# Patient Record
Sex: Male | Born: 1980 | Race: White | Hispanic: No | Marital: Single | Smoking: Current every day smoker
Health system: Southern US, Community
[De-identification: ages and names within clinical notes are randomized; demographics above are authoritative.]

## PROBLEM LIST (undated history)

## (undated) ENCOUNTER — Emergency Department: Admission: EM | Payer: BLUE CROSS/BLUE SHIELD | Source: Home / Self Care

## (undated) DIAGNOSIS — F419 Anxiety disorder, unspecified: Secondary | ICD-10-CM

## (undated) HISTORY — PX: FOOT SURGERY: SHX648

## (undated) HISTORY — PX: OTHER SURGICAL HISTORY: SHX169

---

## 2005-02-09 ENCOUNTER — Emergency Department: Payer: Self-pay | Admitting: General Practice

## 2005-02-10 ENCOUNTER — Emergency Department (HOSPITAL_COMMUNITY): Admission: EM | Admit: 2005-02-10 | Discharge: 2005-02-10 | Payer: Self-pay | Admitting: Emergency Medicine

## 2015-07-17 ENCOUNTER — Emergency Department
Admission: EM | Admit: 2015-07-17 | Discharge: 2015-07-17 | Disposition: A | Discharge status: Home / Self Care | Payer: BLUE CROSS/BLUE SHIELD | Attending: Emergency Medicine | Admitting: Emergency Medicine

## 2015-07-17 ENCOUNTER — Emergency Department: Payer: BLUE CROSS/BLUE SHIELD

## 2015-07-17 ENCOUNTER — Encounter: Payer: Self-pay | Admitting: *Deleted

## 2015-07-17 DIAGNOSIS — F17200 Nicotine dependence, unspecified, uncomplicated: Secondary | ICD-10-CM | POA: Insufficient documentation

## 2015-07-17 DIAGNOSIS — W172XXA Fall into hole, initial encounter: Secondary | ICD-10-CM | POA: Insufficient documentation

## 2015-07-17 DIAGNOSIS — Y92321 Football field as the place of occurrence of the external cause: Secondary | ICD-10-CM | POA: Diagnosis not present

## 2015-07-17 DIAGNOSIS — Y9362 Activity, american flag or touch football: Secondary | ICD-10-CM | POA: Diagnosis not present

## 2015-07-17 DIAGNOSIS — S4991XA Unspecified injury of right shoulder and upper arm, initial encounter: Secondary | ICD-10-CM | POA: Diagnosis present

## 2015-07-17 DIAGNOSIS — S46911A Strain of unspecified muscle, fascia and tendon at shoulder and upper arm level, right arm, initial encounter: Secondary | ICD-10-CM | POA: Insufficient documentation

## 2015-07-17 DIAGNOSIS — Y998 Other external cause status: Secondary | ICD-10-CM | POA: Diagnosis not present

## 2015-07-17 HISTORY — DX: Anxiety disorder, unspecified: F41.9

## 2015-07-17 MED ORDER — CYCLOBENZAPRINE HCL 5 MG PO TABS: 5.0000 mg | ORAL_TABLET | Freq: Three times a day (TID) | ORAL | Status: AC | PRN

## 2015-07-17 MED ORDER — TRAMADOL HCL 50 MG PO TABS: 50.0000 mg | ORAL_TABLET | Freq: Three times a day (TID) | ORAL | Status: DC | PRN

## 2015-07-17 MED ORDER — NABUMETONE 750 MG PO TABS: 750.0000 mg | ORAL_TABLET | Freq: Two times a day (BID) | ORAL | Status: AC

## 2015-07-17 MED ORDER — HYDROCODONE-ACETAMINOPHEN 5-325 MG PO TABS: 1.0000 | ORAL_TABLET | Freq: Four times a day (QID) | ORAL | Status: DC | PRN

## 2015-07-17 NOTE — Discharge Instructions (Signed)
Your exam is normal today. Your x-ray does not show any signs of fracture or dislocation. You are being treated for a shoulder strain. Apply ice and take the prescription meds as directed. Follow-up with Dr. Ernest PineHooten for ongoing symptoms.

## 2015-07-17 NOTE — ED Provider Notes (Signed)
[facility] Emergency Department Provider Note ____________________________________________  Time seen: 0858  I have reviewed the triage vital signs and the nursing notes.  HISTORY  Chief Complaint  Shoulder Injury  HPI Eugene Zimmerman is a 34 y.o. male was ED for evaluation of right shoulder pain since last night. He describes pain in than out visiting family, was playing a game of touch football last night. He describes falling in a shallow hole in the yard, this caused him to land on his right shoulder. He gives a remote history of rotator cuff partial tear some 8 or 9 years prior, without surgical intervention. He also describes some intermittent shoulder laxity and subluxation over the years.He describes dosing Advil and Tylenol this morning. He describes the pain as sharp and stabbing to the shoulder.  Past Medical History  Diagnosis Date  . Anxiety    There are no active problems to display for this patient.  History reviewed. No pertinent past surgical history.  Current Outpatient Rx  Name  Route  Sig  Dispense  Refill  . cyclobenzaprine (FLEXERIL) 5 MG tablet   Oral   Take 1 tablet (5 mg total) by mouth 3 (three) times daily as needed for muscle spasms.   15 tablet   0   . HYDROcodone-acetaminophen (NORCO) 5-325 MG tablet   Oral   Take 1 tablet by mouth every 6 (six) hours as needed for moderate pain.   10 tablet   0   . nabumetone (RELAFEN) 750 MG tablet   Oral   Take 1 tablet (750 mg total) by mouth 2 (two) times daily.   30 tablet   0     Allergies Tramadol  No family history on file.  Social History Social History  Substance Use Topics  . Smoking status: Current Every Day Smoker  . Smokeless tobacco: None  . Alcohol Use: None    Review of Systems  Constitutional: Negative for fever. Eyes: Negative for visual changes. ENT: Negative for sore throat. Cardiovascular: Negative for chest pain. Respiratory: Negative for  shortness of breath. Gastrointestinal: Negative for abdominal pain, vomiting and diarrhea. Genitourinary: Negative for dysuria. Musculoskeletal: Negative for back pain. Right shoulder pain Skin: Negative for rash. Neurological: Negative for headaches, focal weakness or numbness. ____________________________________________  PHYSICAL EXAM:  VITAL SIGNS: ED Triage Vitals  Enc Vitals Group     BP 07/17/15 0842 145/82 mmHg     Pulse Rate 07/17/15 0842 76     Resp 07/17/15 0842 20     Temp 07/17/15 0842 97.8 F (36.6 C)     Temp Source 07/17/15 0842 Oral     SpO2 07/17/15 0842 99 %     Weight 07/17/15 0842 180 lb (81.647 kg)     Height 07/17/15 0842 6\' 2"  (1.88 m)     Head Cir --      Peak Flow --      Pain Score 07/17/15 0843 8     Pain Loc --      Pain Edu? --      Excl. in GC? --     Constitutional: Alert and oriented. Well appearing and in no distress. Head: Normocephalic and atraumatic.      Eyes: Conjunctivae are normal. PERRL. Normal extraocular movements      Ears: Canals clear. TMs intact bilaterally.   Nose: No congestion/rhinorrhea.   Mouth/Throat: Mucous membranes are moist.   Neck: Supple. No thyromegaly. Hematological/Lymphatic/Immunological: No cervical lymphadenopathy. Cardiovascular: Normal rate, regular rhythm.  Respiratory:  Normal respiratory effort. No wheezes/rales/rhonchi. Gastrointestinal: Soft and nontender. No distention. Musculoskeletal: Right shoulder without obvious deformity, sulcus sign, or dislocation. Patient holding the arm in a flexed,  adducted position. He noted to have self-limited range of the shoulder with full active range of motion at the elbow with flexion, extension, and rotation. I am able to passively range him in abduction to 90. He is minimally tender to palpation over the biceps tendon of the right shoulder. Nontender with normal range of motion in all extremities.  Neurologic: Normal composite fist on exam. Normal gait  without ataxia. Normal speech and language. No gross focal neurologic deficits are appreciated. Skin:  Skin is warm, dry and intact. No rash noted. Psychiatric: Mood and affect are normal. Patient exhibits appropriate insight and judgment. ____________________________________________   RADIOLOGY  Right Shoulder IMPRESSION: Normal radiographs  I, Correne Lalani, Charlesetta Ivory, personally viewed and evaluated these images (plain radiographs) as part of my medical decision making, as well as reviewing the written report by the radiologist. ____________________________________________  PROCEDURES  Shoulder immobilizer ____________________________________________  INITIAL IMPRESSION / ASSESSMENT AND PLAN / ED COURSE  Right shoulder strain without evidence of fracture, or dislocation. Patient with a difficult shoulder exam today on presentation. He'll be discharged with a shoulder sling for comfort and support and prescriptions for Relafen, Flexeril, and #10 hydrocodone. He'll follow up with Dr. Ernest Pine or his primary ortho provider for ongoing symptoms. ____________________________________________  FINAL CLINICAL IMPRESSION(S) / ED DIAGNOSES  Final diagnoses:  Shoulder strain, right, initial encounter      Lissa Hoard, PA-C 07/17/15 1104  Sharman Cheek, MD 07/17/15 [phone]

## 2015-08-08 ENCOUNTER — Emergency Department: Payer: BLUE CROSS/BLUE SHIELD

## 2015-08-08 ENCOUNTER — Emergency Department
Admission: EM | Admit: 2015-08-08 | Discharge: 2015-08-09 | Disposition: A | Discharge status: Home / Self Care | Payer: BLUE CROSS/BLUE SHIELD | Attending: Emergency Medicine | Admitting: Emergency Medicine

## 2015-08-08 ENCOUNTER — Encounter: Payer: Self-pay | Admitting: Emergency Medicine

## 2015-08-08 DIAGNOSIS — Y92410 Unspecified street and highway as the place of occurrence of the external cause: Secondary | ICD-10-CM | POA: Insufficient documentation

## 2015-08-08 DIAGNOSIS — Y998 Other external cause status: Secondary | ICD-10-CM | POA: Diagnosis not present

## 2015-08-08 DIAGNOSIS — T148XXA Other injury of unspecified body region, initial encounter: Secondary | ICD-10-CM

## 2015-08-08 DIAGNOSIS — S299XXA Unspecified injury of thorax, initial encounter: Secondary | ICD-10-CM | POA: Diagnosis not present

## 2015-08-08 DIAGNOSIS — S199XXA Unspecified injury of neck, initial encounter: Secondary | ICD-10-CM | POA: Insufficient documentation

## 2015-08-08 DIAGNOSIS — Y9389 Activity, other specified: Secondary | ICD-10-CM | POA: Insufficient documentation

## 2015-08-08 DIAGNOSIS — T148 Other injury of unspecified body region: Secondary | ICD-10-CM | POA: Insufficient documentation

## 2015-08-08 DIAGNOSIS — M542 Cervicalgia: Secondary | ICD-10-CM

## 2015-08-08 DIAGNOSIS — F17200 Nicotine dependence, unspecified, uncomplicated: Secondary | ICD-10-CM | POA: Insufficient documentation

## 2015-08-08 DIAGNOSIS — Z79899 Other long term (current) drug therapy: Secondary | ICD-10-CM | POA: Insufficient documentation
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
## 2015-08-08 NOTE — ED Notes (Signed)
Patient transported to CT 

## 2015-08-09 LAB — CBC WITH DIFFERENTIAL/PLATELET
BASOS ABS: 0.2 10*3/uL — AB (ref 0–0.1)
Basophils Relative: 3 %
EOS ABS: 0.1 10*3/uL (ref 0–0.7)
EOS PCT: 1 %
HCT: 40.1 % (ref 40.0–52.0)
Hemoglobin: 13.7 g/dL (ref 13.0–18.0)
Lymphocytes Relative: 25 %
Lymphs Abs: 1.7 10*3/uL (ref 1.0–3.6)
MCH: 32.3 pg (ref 26.0–34.0)
MCHC: 34.1 g/dL (ref 32.0–36.0)
MCV: 94.7 fL (ref 80.0–100.0)
MONO ABS: 1 10*3/uL (ref 0.2–1.0)
Monocytes Relative: 14 %
Neutro Abs: 3.9 10*3/uL (ref 1.4–6.5)
Neutrophils Relative %: 57 %
PLATELETS: 328 10*3/uL (ref 150–440)
RBC: 4.24 MIL/uL — AB (ref 4.40–5.90)
RDW: 13.3 % (ref 11.5–14.5)
WBC: 6.9 10*3/uL (ref 3.8–10.6)

## 2015-08-09 LAB — TROPONIN I

## 2015-08-09 MED ORDER — CYCLOBENZAPRINE HCL 5 MG PO TABS: 5.0000 mg | ORAL_TABLET | Freq: Three times a day (TID) | ORAL | Status: AC | PRN

## 2015-08-09 NOTE — ED Notes (Signed)
MD Goodman at bedside. 

## 2015-08-09 NOTE — Discharge Instructions (Signed)
Please seek medical attention for any high fevers, chest pain, shortness of breath, change in behavior, persistent vomiting, bloody stool or any other new or concerning symptoms.  Chest Contusion A chest contusion is a deep bruise on your chest area. Contusions are the result of an injury that caused bleeding under the skin. A chest contusion may involve bruising of the skin, muscles, or ribs. The contusion may turn blue, purple, or yellow. Minor injuries will give you a painless contusion, but more severe contusions may stay painful and swollen for a few weeks. CAUSES  A contusion is usually caused by a blow, trauma, or direct force to an area of the body. SYMPTOMS   Swelling and redness of the injured area.  Discoloration of the injured area.  Tenderness and soreness of the injured area.  Pain. DIAGNOSIS  The diagnosis can be made by taking a history and performing a physical exam. An X-ray, CT scan, or MRI may be needed to determine if there were any associated injuries, such as broken bones (fractures) or internal injuries. TREATMENT  Often, the best treatment for a chest contusion is resting, icing, and applying cold compresses to the injured area. Deep breathing exercises may be recommended to reduce the risk of pneumonia. Over-the-counter medicines may also be recommended for pain control. HOME CARE INSTRUCTIONS   Put ice on the injured area.  Put ice in a plastic bag.  Place a towel between your skin and the bag.  Leave the ice on for 15-20 minutes, 03-04 times a day.  Only take over-the-counter or prescription medicines as directed by your caregiver. Your caregiver may recommend avoiding anti-inflammatory medicines (aspirin, ibuprofen, and naproxen) for 48 hours because these medicines may increase bruising.  Rest the injured area.  Perform deep-breathing exercises as directed by your caregiver.  Stop smoking if you smoke.  Do not lift objects over 5 pounds (2.3 kg) for  3 days or longer if recommended by your caregiver. SEEK IMMEDIATE MEDICAL CARE IF:   You have increased bruising or swelling.  You have pain that is getting worse.  You have difficulty breathing.  You have dizziness, weakness, or fainting.  You have blood in your urine or stool.  You cough up or vomit blood.  Your swelling or pain is not relieved with medicines. MAKE SURE YOU:   Understand these instructions.  Will watch your condition.  Will get help right away if you are not doing well or get worse.   This information is not intended to replace advice given to you by your health care provider. Make sure you discuss any questions you have with your health care provider.   Document Released: 03/29/2001 Document Revised: 03/28/2012 Document Reviewed: 12/26/2011 Elsevier Interactive Patient Education 2016 [employer] Cervical Sprain A cervical sprain is an injury in the neck in which the strong, fibrous tissues (ligaments) that connect your neck bones stretch or tear. Cervical sprains can range from mild to severe. Severe cervical sprains can cause the neck vertebrae to be unstable. This can lead to damage of the spinal cord and can result in serious nervous system problems. The amount of time it takes for a cervical sprain to get better depends on the cause and extent of the injury. Most cervical sprains heal in 1 to 3 weeks. CAUSES  Severe cervical sprains may be caused by:   Contact sport injuries (such as from football, rugby, wrestling, hockey, auto racing, gymnastics, diving, martial arts, or boxing).   Motor vehicle collisions.  Whiplash injuries. This is an injury from a sudden forward and backward whipping movement of the head and neck.  Falls.  Mild cervical sprains may be caused by:   Being in an awkward position, such as while cradling a telephone between your ear and shoulder.   Sitting in a chair that does not offer proper support.   Working at a  poorly Marketing executive station.   Looking up or down for long periods of time.  SYMPTOMS   Pain, soreness, stiffness, or a burning sensation in the front, back, or sides of the neck. This discomfort may develop immediately after the injury or slowly, 24 hours or more after the injury.   Pain or tenderness directly in the middle of the back of the neck.   Shoulder or upper back pain.   Limited ability to move the neck.   Headache.   Dizziness.   Weakness, numbness, or tingling in the hands or arms.   Muscle spasms.   Difficulty swallowing or chewing.   Tenderness and swelling of the neck.  DIAGNOSIS  Most of the time your health care provider can diagnose a cervical sprain by taking your history and doing a physical exam. Your health care provider will ask about previous neck injuries and any known neck problems, such as arthritis in the neck. X-rays may be taken to find out if there are any other problems, such as with the bones of the neck. Other tests, such as a CT scan or MRI, may also be needed.  TREATMENT  Treatment depends on the severity of the cervical sprain. Mild sprains can be treated with rest, keeping the neck in place (immobilization), and pain medicines. Severe cervical sprains are immediately immobilized. Further treatment is done to help with pain, muscle spasms, and other symptoms and may include:  Medicines, such as pain relievers, numbing medicines, or muscle relaxants.   Physical therapy. This may involve stretching exercises, strengthening exercises, and posture training. Exercises and improved posture can help stabilize the neck, strengthen muscles, and help stop symptoms from returning.  HOME CARE INSTRUCTIONS   Put ice on the injured area.   Put ice in a plastic bag.   Place a towel between your skin and the bag.   Leave the ice on for 15-20 minutes, 3-4 times a day.   If your injury was severe, you may have been given a cervical  collar to wear. A cervical collar is a two-piece collar designed to keep your neck from moving while it heals.  Do not remove the collar unless instructed by your health care provider.  If you have long hair, keep it outside of the collar.  Ask your health care provider before making any adjustments to your collar. Minor adjustments may be required over time to improve comfort and reduce pressure on your chin or on the back of your head.  Ifyou are allowed to remove the collar for cleaning or bathing, follow your health care provider's instructions on how to do so safely.  Keep your collar clean by wiping it with mild soap and water and drying it completely. If the collar you have been given includes removable pads, remove them every 1-2 days and hand wash them with soap and water. Allow them to air dry. They should be completely dry before you wear them in the collar.  If you are allowed to remove the collar for cleaning and bathing, wash and dry the skin of your neck. Check your skin for  irritation or sores. If you see any, tell your health care provider.  Do not drive while wearing the collar.   Only take over-the-counter or prescription medicines for pain, discomfort, or fever as directed by your health care provider.   Keep all follow-up appointments as directed by your health care provider.   Keep all physical therapy appointments as directed by your health care provider.   Make any needed adjustments to your workstation to promote good posture.   Avoid positions and activities that make your symptoms worse.   Warm up and stretch before being active to help prevent problems.  SEEK MEDICAL CARE IF:   Your pain is not controlled with medicine.   You are unable to decrease your pain medicine over time as planned.   Your activity level is not improving as expected.  SEEK IMMEDIATE MEDICAL CARE IF:   You develop any bleeding.  You develop stomach upset.  You have  signs of an allergic reaction to your medicine.   Your symptoms get worse.   You develop new, unexplained symptoms.   You have numbness, tingling, weakness, or paralysis in any part of your body.  MAKE SURE YOU:   Understand these instructions.  Will watch your condition.  Will get help right away if you are not doing well or get worse.   This information is not intended to replace advice given to you by your health care provider. Make sure you discuss any questions you have with your health care provider.   Document Released: 05/01/2007 Document Revised: 07/09/2013 Document Reviewed: 01/09/2013 Elsevier Interactive Patient Education [employer]

## 2015-09-05 ENCOUNTER — Encounter (HOSPITAL_COMMUNITY): Payer: Self-pay | Admitting: Nurse Practitioner

## 2015-09-05 ENCOUNTER — Emergency Department
Admission: EM | Admit: 2015-09-05 | Discharge: 2015-09-05 | Disposition: A | Discharge status: Home / Self Care | Payer: BLUE CROSS/BLUE SHIELD

## 2015-09-05 ENCOUNTER — Emergency Department (HOSPITAL_COMMUNITY): Payer: BLUE CROSS/BLUE SHIELD

## 2015-09-05 ENCOUNTER — Emergency Department (HOSPITAL_COMMUNITY)
Admission: EM | Admit: 2015-09-05 | Discharge: 2015-09-05 | Disposition: A | Discharge status: Home / Self Care | Payer: BLUE CROSS/BLUE SHIELD | Attending: Emergency Medicine | Admitting: Emergency Medicine

## 2015-09-05 ENCOUNTER — Emergency Department: Payer: BLUE CROSS/BLUE SHIELD

## 2015-09-05 ENCOUNTER — Encounter: Payer: Self-pay | Admitting: Emergency Medicine

## 2015-09-05 ENCOUNTER — Ambulatory Visit
Admission: RE | Admit: 2015-09-05 | Discharge: 2015-09-05 | Disposition: A | Discharge status: Home / Self Care | Payer: BLUE CROSS/BLUE SHIELD | Source: Ambulatory Visit | Attending: Emergency Medicine | Admitting: Emergency Medicine

## 2015-09-05 DIAGNOSIS — Z791 Long term (current) use of non-steroidal anti-inflammatories (NSAID): Secondary | ICD-10-CM | POA: Insufficient documentation

## 2015-09-05 DIAGNOSIS — M7918 Myalgia, other site: Secondary | ICD-10-CM

## 2015-09-05 DIAGNOSIS — Y9289 Other specified places as the place of occurrence of the external cause: Secondary | ICD-10-CM | POA: Insufficient documentation

## 2015-09-05 DIAGNOSIS — Y9389 Activity, other specified: Secondary | ICD-10-CM | POA: Insufficient documentation

## 2015-09-05 DIAGNOSIS — Y998 Other external cause status: Secondary | ICD-10-CM | POA: Insufficient documentation

## 2015-09-05 DIAGNOSIS — Z79899 Other long term (current) drug therapy: Secondary | ICD-10-CM | POA: Insufficient documentation

## 2015-09-05 DIAGNOSIS — F17200 Nicotine dependence, unspecified, uncomplicated: Secondary | ICD-10-CM | POA: Insufficient documentation

## 2015-09-05 DIAGNOSIS — S4991XA Unspecified injury of right shoulder and upper arm, initial encounter: Secondary | ICD-10-CM | POA: Insufficient documentation

## 2015-09-05 DIAGNOSIS — S3992XA Unspecified injury of lower back, initial encounter: Secondary | ICD-10-CM | POA: Insufficient documentation

## 2015-09-05 DIAGNOSIS — F419 Anxiety disorder, unspecified: Secondary | ICD-10-CM | POA: Insufficient documentation

## 2015-09-05 DIAGNOSIS — Z0000 Encounter for general adult medical examination without abnormal findings: Secondary | ICD-10-CM | POA: Insufficient documentation

## 2015-09-05 DIAGNOSIS — W11XXXA Fall on and from ladder, initial encounter: Secondary | ICD-10-CM | POA: Insufficient documentation
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
## 2015-09-05 NOTE — ED Notes (Signed)
Pt stepped outside immediately after talking with first nurse, unable to obtain vital signs at this time.

## 2015-09-05 NOTE — ED Notes (Signed)
Pt was climbing down a ladder the morning, he fell approx 13 feet onto dirt. He c/o sharp R shoulder and lower back pain radiating down bilateral legs since. Pain increased with ambulation and weight bearing. He denies head injury or LOC. He is ambulatory, mae.
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
## 2015-09-05 NOTE — ED Notes (Signed)
Declined W/C at D/C and was escorted to lobby by RN. 

## 2015-09-05 NOTE — Discharge Instructions (Signed)
Follow-up with your doctors as needed. Take Motrin and Tylenol for discomfort.  Muscle Pain, Adult Muscle pain (myalgia) may be caused by many things, including:  Overuse or muscle strain, especially if you are not in shape. This is the most common cause of muscle pain.  Injury.  Bruises.  Viruses, such as the flu.  Infectious diseases.  Fibromyalgia, which is a chronic condition that causes muscle tenderness, fatigue, and headache.  Autoimmune diseases, including lupus.  Certain drugs, including ACE inhibitors and statins. Muscle pain may be mild or severe. In most cases, the pain lasts only a short time and goes away without treatment. To diagnose the cause of your muscle pain, your health care provider will take your medical history. This means he or she will ask you when your muscle pain began and what has been happening. If you have not had muscle pain for very long, your health care provider may want to wait before doing much testing. If your muscle pain has lasted a long time, your health care provider may want to run tests right away. If your health care provider thinks your muscle pain may be caused by illness, you may need to have additional tests to rule out certain conditions.  Treatment for muscle pain depends on the cause. Home care is often enough to relieve muscle pain. Your health care provider may also prescribe anti-inflammatory medicine. HOME CARE INSTRUCTIONS Watch your condition for any changes. The following actions may help to lessen any discomfort you are feeling:  Only take over-the-counter or prescription medicines as directed by your health care provider.  Apply ice to the sore muscle:  Put ice in a plastic bag.  Place a towel between your skin and the bag.  Leave the ice on for 15-20 minutes, 3-4 times a day.  You may alternate applying hot and cold packs to the muscle as directed by your health care provider.  If overuse is causing your muscle pain,  slow down your activities until the pain goes away.  Remember that it is normal to feel some muscle pain after starting a workout program. Muscles that have not been used often will be sore at first.  Do regular, gentle exercises if you are not usually active.  Warm up before exercising to lower your risk of muscle pain.  Do not continue working out if the pain is very bad. Bad pain could mean you have injured a muscle. SEEK MEDICAL CARE IF:  Your muscle pain gets worse, and medicines do not help.  You have muscle pain that lasts longer than 3 days.  You have a rash or fever along with muscle pain.  You have muscle pain after a tick bite.  You have muscle pain while working out, even though you are in good physical condition.  You have redness, soreness, or swelling along with muscle pain.  You have muscle pain after starting a new medicine or changing the dose of a medicine. SEEK IMMEDIATE MEDICAL CARE IF:  You have trouble breathing.  You have trouble swallowing.  You have muscle pain along with a stiff neck, fever, and vomiting.  You have severe muscle weakness or cannot move part of your body. MAKE SURE YOU:   Understand these instructions.  Will watch your condition.  Will get help right away if you are not doing well or get worse.   This information is not intended to replace advice given to you by your health care provider. Make sure you discuss  any questions you have with your health care provider.   Document Released: 05/26/2006 Document Revised: 07/25/2014 Document Reviewed: 04/30/2013 Elsevier Interactive Patient Education [employer]

## 2015-09-05 NOTE — ED Notes (Signed)
Pt called by triage nurse. No answer at this time. Stickers remain at front desk.

## 2015-09-06 ENCOUNTER — Encounter: Payer: Self-pay | Admitting: Emergency Medicine

## 2015-09-06 ENCOUNTER — Emergency Department: Payer: BLUE CROSS/BLUE SHIELD

## 2015-09-06 DIAGNOSIS — F17200 Nicotine dependence, unspecified, uncomplicated: Secondary | ICD-10-CM | POA: Insufficient documentation

## 2015-09-06 DIAGNOSIS — Z79899 Other long term (current) drug therapy: Secondary | ICD-10-CM | POA: Insufficient documentation

## 2015-09-06 DIAGNOSIS — M25512 Pain in left shoulder: Secondary | ICD-10-CM | POA: Diagnosis present

## 2015-09-06 DIAGNOSIS — Z9889 Other specified postprocedural states: Secondary | ICD-10-CM | POA: Diagnosis not present

## 2015-09-06 DIAGNOSIS — G8929 Other chronic pain: Secondary | ICD-10-CM | POA: Diagnosis not present

## 2015-09-06 NOTE — ED Notes (Signed)
Patient ambulatory to triage with steady gait, without difficulty or distress noted; pt reports possible left shoulder dislocation; st was moving dryer down steps and felt pop in shoulder; st hx rotator cuff tear and need surgery

## 2015-09-07 ENCOUNTER — Emergency Department (HOSPITAL_COMMUNITY): Payer: BLUE CROSS/BLUE SHIELD

## 2015-09-07 ENCOUNTER — Emergency Department (HOSPITAL_COMMUNITY)
Admission: EM | Admit: 2015-09-07 | Discharge: 2015-09-07 | Disposition: A | Discharge status: Home / Self Care | Payer: BLUE CROSS/BLUE SHIELD | Attending: Emergency Medicine | Admitting: Emergency Medicine

## 2015-09-07 ENCOUNTER — Emergency Department
Admission: EM | Admit: 2015-09-07 | Discharge: 2015-09-07 | Disposition: A | Discharge status: Home / Self Care | Payer: BLUE CROSS/BLUE SHIELD | Attending: Emergency Medicine | Admitting: Emergency Medicine

## 2015-09-07 ENCOUNTER — Encounter (HOSPITAL_COMMUNITY): Payer: Self-pay

## 2015-09-07 DIAGNOSIS — Y92410 Unspecified street and highway as the place of occurrence of the external cause: Secondary | ICD-10-CM | POA: Insufficient documentation

## 2015-09-07 DIAGNOSIS — M545 Low back pain, unspecified: Secondary | ICD-10-CM

## 2015-09-07 DIAGNOSIS — R52 Pain, unspecified: Secondary | ICD-10-CM

## 2015-09-07 DIAGNOSIS — S3992XA Unspecified injury of lower back, initial encounter: Secondary | ICD-10-CM | POA: Insufficient documentation

## 2015-09-07 DIAGNOSIS — Y999 Unspecified external cause status: Secondary | ICD-10-CM | POA: Diagnosis not present

## 2015-09-07 DIAGNOSIS — S4991XA Unspecified injury of right shoulder and upper arm, initial encounter: Secondary | ICD-10-CM | POA: Insufficient documentation

## 2015-09-07 DIAGNOSIS — R51 Headache: Secondary | ICD-10-CM | POA: Insufficient documentation

## 2015-09-07 DIAGNOSIS — R062 Wheezing: Secondary | ICD-10-CM | POA: Diagnosis not present

## 2015-09-07 DIAGNOSIS — Y9389 Activity, other specified: Secondary | ICD-10-CM | POA: Insufficient documentation

## 2015-09-07 DIAGNOSIS — M25512 Pain in left shoulder: Secondary | ICD-10-CM

## 2015-09-07 DIAGNOSIS — Z79899 Other long term (current) drug therapy: Secondary | ICD-10-CM | POA: Insufficient documentation

## 2015-09-07 DIAGNOSIS — S199XXA Unspecified injury of neck, initial encounter: Secondary | ICD-10-CM | POA: Insufficient documentation

## 2015-09-07 DIAGNOSIS — F419 Anxiety disorder, unspecified: Secondary | ICD-10-CM | POA: Insufficient documentation

## 2015-09-07 DIAGNOSIS — F17200 Nicotine dependence, unspecified, uncomplicated: Secondary | ICD-10-CM | POA: Diagnosis not present

## 2015-09-07 DIAGNOSIS — M25511 Pain in right shoulder: Secondary | ICD-10-CM

## 2015-09-07 MED ORDER — IBUPROFEN 400 MG PO TABS
800.0000 mg | ORAL_TABLET | Freq: Once | ORAL | Status: AC
  Administered 2015-09-07: 800 mg via ORAL
  Filled 2015-09-07: qty 2

## 2015-09-07 MED ORDER — IBUPROFEN 800 MG PO TABS: 800.0000 mg | ORAL_TABLET | Freq: Three times a day (TID) | ORAL | Status: AC | PRN

## 2015-09-07 NOTE — Discharge Instructions (Signed)
Fortunately there was no evidence of bony injury or dislocation at this time.  Use ice packs as needed, over-the-counter ibuprofen and Tylenol for pain control, and use your left shoulder and arm as allowed by your discomfort.  If you keep it completely still and did not use that it will become stiff and sore and take longer to heal.  Follow up with your orthopedic surgeon in 4 days as scheduled.   Shoulder Pain The shoulder is the joint that connects your arm to your body. Muscles and band-like tissues that connect bones to muscles (tendons) hold the joint together. Shoulder pain is felt if an injury or medical problem affects one or more parts of the shoulder. HOME CARE   Put ice on the sore area.  Put ice in a plastic bag.  Place a towel between your skin and the bag.  Leave the ice on for 15-20 minutes, 03-04 times a day for the first 2 days.  Stop using cold packs if they do not help with the pain.  If you were given something to keep your shoulder from moving (sling; shoulder immobilizer), wear it as told. Only take it off to shower or bathe.  Move your arm as little as possible, but keep your hand moving to prevent puffiness (swelling).  Squeeze a soft ball or foam pad as much as possible to help prevent swelling.  Take medicine as told by your doctor. GET HELP IF:  You have progressing new pain in your arm, hand, or fingers.  Your hand or fingers get cold.  Your medicine does not help lessen your pain. GET HELP RIGHT AWAY IF:   Your arm, hand, or fingers are numb or tingling.  Your arm, hand, or fingers are puffy (swollen), painful, or turn white or blue. MAKE SURE YOU:   Understand these instructions.  Will watch your condition.  Will get help right away if you are not doing well or get worse.   This information is not intended to replace advice given to you by your health care provider. Make sure you discuss any questions you have with your health care  provider.   Document Released: 12/21/2007 Document Revised: 07/25/2014 Document Reviewed: 10/27/2014 Elsevier Interactive Patient Education 2016 Else[employer] Inc.  Shoulder Range of Motion Exercises Shoulder range of motion (ROM) exercises are designed to keep the shoulder moving freely. They are often recommended for people who have shoulder pain. MOVEMENT EXERCISE When you are able, do this exercise 5-6 days per week, or as told by your health care provider. Work toward doing 2 sets of 10 swings. Pendulum Exercise How To Do This Exercise Lying Down  Lie face-down on a bed with your abdomen close to the side of the bed.  Let your arm hang over the side of the bed.  Relax your shoulder, arm, and hand.  Slowly and gently swing your arm forward and back. Do not use your neck muscles to swing your arm. They should be relaxed. If you are struggling to swing your arm, have someone gently swing it for you. When you do this exercise for the first time, swing your arm at a 15 degree angle for 15 seconds, or swing your arm 10 times. As pain lessens over time, increase the angle of the swing to 30-45 degrees.  Repeat steps 1-4 with the other arm. How To Do This Exercise While Standing  Stand next to a sturdy chair or table and hold on to it with your hand.  Bend forward at the waist.  Bend your knees slightly.  Relax your other arm and let it hang limp.  Relax the shoulder blade of the arm that is hanging and let it drop.  While keeping your shoulder relaxed, use body motion to swing your arm in small circles. The first time you do this exercise, swing your arm for about 30 seconds or 10 times. When you do it next time, swing your arm for a little longer.  Stand up tall and relax.  Repeat steps 1-7, this time changing the direction of the circles.  Repeat steps 1-8 with the other arm. STRETCHING EXERCISES Do these exercises 3-4 times per day on 5-6 days per week or as told by your health  care provider. Work toward holding the stretch for 20 seconds. Stretching Exercise 1  Lift your arm straight out in front of you.  Bend your arm 90 degrees at the elbow (right angle) so your forearm goes across your body and looks like the letter "L."  Use your other arm to gently pull the elbow forward and across your body.  Repeat steps 1-3 with the other arm. Stretching Exercise 2 You will need a towel or rope for this exercise.  Bend one arm behind your back with the palm facing outward.  Hold a towel with your other hand.  Reach the arm that holds the towel above your head, and bend that arm at the elbow. Your wrist should be behind your neck.  Use your free hand to grab the free end of the towel.  With the higher hand, gently pull the towel up behind you.  With the lower hand, pull the towel down behind you.  Repeat steps 1-6 with the other arm. STRENGTHENING EXERCISES Do each of these exercises at four different times of day (sessions) every day or as told by your health care provider. To begin with, repeat each exercise 5 times (repetitions). Work toward doing 3 sets of 12 repetitions or as told by your health care provider. Strengthening Exercise 1 You will need a light weight for this activity. As you grow stronger, you may use a heavier weight.  Standing with a weight in your hand, lift your arm straight out to the side until it is at the same height as your shoulder.  Bend your arm at 90 degrees so that your fingers are pointing to the ceiling.  Slowly raise your hand until your arm is straight up in the air.  Repeat steps 1-3 with the other arm. Strengthening Exercise 2 You will need a light weight for this activity. As you grow stronger, you may use a heavier weight.  Standing with a weight in your hand, gradually move your straight arm in an arc, starting at your side, then out in front of you, then straight up over your head.  Gradually move your other arm  in an arc, starting at your side, then out in front of you, then straight up over your head.  Repeat steps 1-2 with the other arm. Strengthening Exercise 3 You will need an elastic band for this activity. As you grow stronger, gradually increase the size of the bands or increase the number of bands that you use at one time. 1. While standing, hold an elastic band in one hand and raise that arm up in the air. 2. With your other hand, pull down the band until that hand is by your side. 3. Repeat steps 1-2 with the other arm.  This information is not intended to replace advice given to you by your health care provider. Make sure you discuss any questions you have with your health care provider.   Document Released: 04/02/2003 Document Revised: 11/18/2014 Document Reviewed: 06/30/2014 Elsevier Interactive Patient Education 2016 Else[employer] Inc.  Cryotherapy Cryotherapy means treatment with cold. Ice or gel packs can be used to reduce both pain and swelling. Ice is the most helpful within the first 24 to 48 hours after an injury or flare-up from overusing a muscle or joint. Sprains, strains, spasms, burning pain, shooting pain, and aches can all be eased with ice. Ice can also be used when recovering from surgery. Ice is effective, has very few side effects, and is safe for most people to use. PRECAUTIONS  Ice is not a safe treatment option for people with:  Raynaud phenomenon. This is a condition affecting small blood vessels in the extremities. Exposure to cold may cause your problems to return.  Cold hypersensitivity. There are many forms of cold hypersensitivity, including:  Cold urticaria. Red, itchy hives appear on the skin when the tissues begin to warm after being iced.  Cold erythema. This is a red, itchy rash caused by exposure to cold.  Cold hemoglobinuria. Red blood cells break down when the tissues begin to warm after being iced. The hemoglobin that carry oxygen are passed into the  urine because they cannot combine with blood proteins fast enough.  Numbness or altered sensitivity in the area being iced. If you have any of the following conditions, do not use ice until you have discussed cryotherapy with your caregiver:  Heart conditions, such as arrhythmia, angina, or chronic heart disease.  High blood pressure.  Healing wounds or open skin in the area being iced.  Current infections.  Rheumatoid arthritis.  Poor circulation.  Diabetes. Ice slows the blood flow in the region it is applied. This is beneficial when trying to stop inflamed tissues from spreading irritating chemicals to surrounding tissues. However, if you expose your skin to cold temperatures for too long or without the proper protection, you can damage your skin or nerves. Watch for signs of skin damage due to cold. HOME CARE INSTRUCTIONS Follow these tips to use ice and cold packs safely.  Place a dry or damp towel between the ice and skin. A damp towel will cool the skin more quickly, so you may need to shorten the time that the ice is used.  For a more rapid response, add gentle compression to the ice.  Ice for no more than 10 to 20 minutes at a time. The bonier the area you are icing, the less time it will take to get the benefits of ice.  Check your skin after 5 minutes to make sure there are no signs of a poor response to cold or skin damage.  Rest 20 minutes or more between uses.  Once your skin is numb, you can end your treatment. You can test numbness by very lightly touching your skin. The touch should be so light that you do not see the skin dimple from the pressure of your fingertip. When using ice, most people will feel these normal sensations in this order: cold, burning, aching, and numbness.  Do not use ice on someone who cannot communicate their responses to pain, such as small children or people with dementia. HOW TO MAKE AN ICE PACK Ice packs are the most common way to use  ice therapy. Other methods include ice massage, ice  baths, and cryosprays. Muscle creams that cause a cold, tingly feeling do not offer the same benefits that ice offers and should not be used as a substitute unless recommended by your caregiver. To make an ice pack, do one of the following:  Place crushed ice or a bag of frozen vegetables in a sealable plastic bag. Squeeze out the excess air. Place this bag inside another plastic bag. Slide the bag into a pillowcase or place a damp towel between your skin and the bag.  Mix 3 parts water with 1 part rubbing alcohol. Freeze the mixture in a sealable plastic bag. When you remove the mixture from the freezer, it will be slushy. Squeeze out the excess air. Place this bag inside another plastic bag. Slide the bag into a pillowcase or place a damp towel between your skin and the bag. SEEK MEDICAL CARE IF:  You develop white spots on your skin. This may give the skin a blotchy (mottled) appearance.  Your skin turns blue or pale.  Your skin becomes waxy or hard.  Your swelling gets worse. MAKE SURE YOU:   Understand these instructions.  Will watch your condition.  Will get help right away if you are not doing well or get worse.   This information is not intended to replace advice given to you by your health care provider. Make sure you discuss any questions you have with your health care provider.   Document Released: 02/28/2011 Document Revised: 07/25/2014 Document Reviewed: 02/28/2011 Elsevier Interactive Patient Education 2016 Else[employer] Inc.  [employer] Therapy Heat therapy can help ease sore, stiff, injured, and tight muscles and joints. Heat relaxes your muscles, which may help ease your pain.  RISKS AND COMPLICATIONS If you have any of the following conditions, do not use heat therapy unless your health care provider has approved:  Poor circulation.  Healing wounds or scarred skin in the area being treated.  Diabetes, heart disease, or  high blood pressure.  Not being able to feel (numbness) the area being treated.  Unusual swelling of the area being treated.  Active infections.  Blood clots.  Cancer.  Inability to communicate pain. This may include young children and people who have problems with their brain function (dementia).  Pregnancy. Heat therapy should only be used on old, pre-existing, or long-lasting (chronic) injuries. Do not use heat therapy on new injuries unless directed by your health care provider. HOW TO USE HEAT THERAPY There are several different kinds of heat therapy, including:  Moist heat pack.  Warm water bath.  Hot water bottle.  Electric heating pad.  Heated gel pack.  Heated wrap.  Electric heating pad. Use the heat therapy method suggested by your health care provider. Follow your health care provider's instructions on when and how to use heat therapy. GENERAL HEAT THERAPY RECOMMENDATIONS  Do not sleep while using heat therapy. Only use heat therapy while you are awake.  Your skin may turn pink while using heat therapy. Do not use heat therapy if your skin turns red.  Do not use heat therapy if you have new pain.  High heat or long exposure to heat can cause burns. Be careful when using heat therapy to avoid burning your skin.  Do not use heat therapy on areas of your skin that are already irritated, such as with a rash or sunburn. SEEK MEDICAL CARE IF:  You have blisters, redness, swelling, or numbness.  You have new pain.  Your pain is worse. MAKE SURE YOU:  Understand these instructions.  Will watch your condition.  Will get help right away if you are not doing well or get worse.   This information is not intended to replace advice given to you by your health care provider. Make sure you discuss any questions you have with your health care provider.   Document Released: 09/26/2011 Document Revised: 07/25/2014 Document Reviewed: 08/27/2013 Elsevier  Interactive Patient Education [employer]

## 2015-09-07 NOTE — Discharge Instructions (Signed)
Read the information below.  Use the prescribed medication as directed.  Please discuss all new medications with your pharmacist.  You may return to the Emergency Department at any time for worsening condition or any new symptoms that concern you.    If you develop fevers, loss of control of bowel or bladder, weakness or numbness in your legs, or are unable to walk, return to the ER for a recheck.     Back Pain, Adult Back pain is very common in adults.The cause of back pain is rarely dangerous and the pain often gets better over time.The cause of your back pain may not be known. Some common causes of back pain include:  Strain of the muscles or ligaments supporting the spine.  Wear and tear (degeneration) of the spinal disks.  Arthritis.  Direct injury to the back. For many people, back pain may return. Since back pain is rarely dangerous, most people can learn to manage this condition on their own. HOME CARE INSTRUCTIONS Watch your back pain for any changes. The following actions may help to lessen any discomfort you are feeling:  Remain active. It is stressful on your back to sit or stand in one place for long periods of time. Do not sit, drive, or stand in one place for more than 30 minutes at a time. Take short walks on even surfaces as soon as you are able.Try to increase the length of time you walk each day.  Exercise regularly as directed by your health care provider. Exercise helps your back heal faster. It also helps avoid future injury by keeping your muscles strong and flexible.  Do not stay in bed.Resting more than 1-2 days can delay your recovery.  Pay attention to your body when you bend and lift. The most comfortable positions are those that put less stress on your recovering back. Always use proper lifting techniques, including:  Bending your knees.  Keeping the load close to your body.  Avoiding twisting.  Find a comfortable position to sleep. Use a firm mattress  and lie on your side with your knees slightly bent. If you lie on your back, put a pillow under your knees.  Avoid feeling anxious or stressed.Stress increases muscle tension and can worsen back pain.It is important to recognize when you are anxious or stressed and learn ways to manage it, such as with exercise.  Take medicines only as directed by your health care provider. Over-the-counter medicines to reduce pain and inflammation are often the most helpful.Your health care provider may prescribe muscle relaxant drugs.These medicines help dull your pain so you can more quickly return to your normal activities and healthy exercise.  Apply ice to the injured area:  Put ice in a plastic bag.  Place a towel between your skin and the bag.  Leave the ice on for 20 minutes, 2-3 times a day for the first 2-3 days. After that, ice and heat may be alternated to reduce pain and spasms.  Maintain a healthy weight. Excess weight puts extra stress on your back and makes it difficult to maintain good posture. SEEK MEDICAL CARE IF:  You have pain that is not relieved with rest or medicine.  You have increasing pain going down into the legs or buttocks.  You have pain that does not improve in one week.  You have night pain.  You lose weight.  You have a fever or chills. SEEK IMMEDIATE MEDICAL CARE IF:   You develop new bowel or bladder  control problems.  You have unusual weakness or numbness in your arms or legs.  You develop nausea or vomiting.  You develop abdominal pain.  You feel faint.   This information is not intended to replace advice given to you by your health care provider. Make sure you discuss any questions you have with your health care provider.   Document Released: 07/04/2005 Document Revised: 07/25/2014 Document Reviewed: 11/05/2013 Elsevier Interactive Patient Education 2016 [employer]    Emergency Department Resource Guide 1) Find a Doctor and Pay Out of  Pocket Although you won't have to find out who is covered by your insurance plan, it is a good idea to ask around and get recommendations. You will then need to call the office and see if the doctor you have chosen will accept you as a new patient and what types of options they offer for patients who are self-pay. Some doctors offer discounts or will set up payment plans for their patients who do not have insurance, but you will need to ask so you aren't surprised when you get to your appointment.  2) Contact Your Local Health Department Not all health departments have doctors that can see patients for sick visits, but many do, so it is worth a call to see if yours does. If you don't know where your local health department is, you can check in your phone book. The CDC also has a tool to help you locate your state's health department, and many state websites also have listings of all of their local health departments.  3) Find a Walk-in Clinic If your illness is not likely to be very severe or complicated, you may want to try a walk in clinic. These are popping up all over the country in pharmacies, drugstores, and shopping centers. They're usually staffed by nurse practitioners or physician assistants that have been trained to treat common illnesses and complaints. They're usually fairly quick and inexpensive. However, if you have serious medical issues or chronic medical problems, these are probably not your best option.  No Primary Care Doctor: - Call Health Connect at  [phone] - they can help you locate a primary care doctor that  accepts your insurance, provides certain services, etc. - Physician Referral Service- [phone]  Chronic Pain Problems: Organization         Address  Phone   Notes  [facility]  [phone] Patients need to be referred by their primary care doctor.   Medication Assistance: Organization         Address  Phone   Notes  [facility]  Medication [facility] [address] [place], [place] [address] [phone]) [phone] --Must be a resident of [place] -- Must have NO insurance coverage whatsoever (no Medicaid/ Medicare, etc.) -- The pt. MUST have a primary care doctor that directs their care regularly and follows them in the community   MedAssist  [phone]   [employer]  [phone]    Agencies that provide inexpensive medical care: Organization         Address  Phone   Notes  [facility]  [phone]   [facility]    [phone]) [phone]   [facility] [address] [place], [place] [address] [phone]) [phone]   [facility] [address], [place] [phone]   [facility]anned Parenthood    [phone]   [facility]    [phone]  [phone]   [address], [place] Phone:  [phone], Fax:  [phone] Hours of Operation:  9 am - 6 pm, M-F.  Also accepts Medicaid/Medicare and self-pay.  [facility] for [address], [address], [place] Phone: [phone], Fax: [phone]. Hours of Operation:  8:30 am - 5:30 pm, M-F.  Also accepts Medicaid and self-pay.  [facility] [address], [place] Phone: [phone]   [place], [place], [place] [phone], Ext. [phone] Mondays & Thursdays: 7-9 AM.  First 15 patients are seen on a first come, first serve basis.    [place] Providers:  Organization         Address  Phone   Notes  [facility] [address], [place] [phone] Also accepts self-pay patients.  [facility] [address], [place]  [phone]   [place], [address], [place] [phone]   [facility] [address], [place] [phone]   Lucianne Lei [address], [place]   [phone] Only accepts [place] Access [place] patients after they have their name applied to their card.   Self-Pay (no insurance) in [facility]:  Organization         Address  Phone   Notes  Sickle Cell Patients, [facility] [place] [phone]   [facility] [place] [phone]   [facility] [place]  [place], [place], [place] [phone]   [facility]/Dr. Osei-Bonsu  [address], [place] or [address], [place] [phone] Phone number for both [place] and [place] locations is the same.  [facility] [address], [place] [phone]   [facility] [address] [place] or [address] [phone]   [facility] [address] [place] [phone], phone; [phone], fax Sees patients 1st and 3rd Saturday of every month.  Must not qualify for public or private insurance (i.e. Medicaid, Medicare, [place] Health Choice, Veterans' Benefits)  Household income should be no more than 200% of the poverty level The clinic cannot treat you if you are pregnant or think you are pregnant  Sexually transmitted diseases are not treated at the clinic.    Dental Care: Organization         Address  Phone  Notes  [facility] [place] [phone] Accepts children up to age 89 who are enrolled in [place] or [place]; pregnant women with a Medicaid card; and children who have applied for Medicaid or [place] Health Choice, but were declined, whose parents can pay a reduced fee at time of service.  [facility]  [address], [place]  [phone] Accepts children up to age 65 who are enrolled in [place] or [place]; pregnant women with a Medicaid card; and children who have applied for Medicaid or [place] Health Choice, but were declined, whose parents can pay a reduced fee at time of service.  [place]  Access PROGRAM  [place] [phone] Patients are seen by appointment only. Walk-ins are not accepted. [place] will see patients 16 years of age and older. Monday - Tuesday (8am-5pm) Most Wednesdays (8:30-5pm) $30 per visit, cash only  [facility] Adult Dental Access PROGRAM  [address], [facility] [phone] Patients are seen by appointment only. Walk-ins are not accepted. [place] will see patients 36 years of age and older. One Wednesday Evening (Monthly: Volunteer Based).  $30 per visit, cash only  [place]  [phone] for adults; Children under age 32, call Graduate Pediatric Dentistry at [phone]. Children aged 60-14, please call [phone] to request a pediatric application.  Dental services are provided in all areas of dental care including fillings, crowns and bridges, complete and partial dentures, implants, gum treatment, root canals, and extractions. Preventive care is also provided. Treatment is provided to both adults and children. Patients are selected via a lottery and there is often a waiting list.   [facility] [address] [place]  [phone] [email or web address]   [facility] [address] [place], [place] [phone], Ext. [phone] Second and Fourth Thursday of each month, opens at 6:30 AM; Clinic ends at 9 AM.  Patients are seen on a first-come first-served basis, and a limited number are seen during each clinic.   [facility]  [address] Hillard Danker [place], [place] [phone]   Eligibility Requirements You must have lived in [place], [place], or [place]  counties for at least the last three months.   You cannot be eligible for state or federal sponsored [employer], including [employer], [place], or [employer].   You generally cannot be eligible for healthcare insurance through your employer.    How to apply: Eligibility screenings are held every Tuesday and Wednesday afternoon from 1:00 pm until 4:00 pm. You do not need an appointment for the interview!  [facility] [address] [place], [place]   [place]  [place] Department  [place]  [phone]    Behavioral Health Resources in the Community: Intensive Outpatient Programs Organization         Address  Phone  Notes  [place]. [address], [place], [place] [phone]   [facility] Outpatient [address], [place], [place]   ADS: Alcohol & Drug Svcs [address], [place], [place]   [place] [address],  [place], [place] or [phone]   Substance Abuse Resources Organization         Address  Phone  Notes  Alcohol and Drug Services  4502198358   [place]  [phone]   [facility] [place]   Chinita Pester  [phone]   Residential & Outpatient Substance Abuse Program  3468468268   Psychological Services Organization         Address  Phone  Notes  [facility] [place]  [place]  [phone]   [place] [address], [place] or [phone]    Mobile Crisis Teams Organization         Address  Phone  Notes  [facility], Mobile Crisis Care Unit  [phone]   [facility]  [address] [place], [place]  [phone]   [facility] [address] [place]  [place] [phone]    Self-Help/Support Groups Organization         Address  Phone             Notes  Mental Health Assoc. of [place] - variety of support groups  336- I7437963 Call for more information  Narcotics Anonymous (NA), [facility]aring [facility] [address], [employer] [place]  2 meetings at this location   Statistician         Address  Phone  Notes  [facility] [address],    [place]  [phone]   [facility]  [address] [place] [address], [place], [place] [phone]   [facility] [address] [place], [place] [phone] Admissions: 8am-3pm M-F  [facility] [address]    [place], [place] [phone]   [facility] [address] [place], [place], [place] [phone]   [facility] [address],  [place], [place] [phone]   [facility] - Intensive Outpatient [address], [place], [place] [phone]   [facility] (Addiction Recovery Care Assoc.) [address] [place].,  [place], [place] [phone] or [phone]   [facility]idential Treatment Services (RTS) [address], [place], [place] [phone] Accepts Medicaid  [facility] [place] [address],  [place] [phone] Substance Abuse/Addiction Treatment   [facility] Organization         Address  Phone  Notes  [facility]  [phone]   Angie Fava, PhD [address] Ervin Knack [place], [place]   [phone] or [phone]   [facility] Behavioral   [address] [place], [place] [phone]   [facility] [address] [place], [place] [phone] Insurance/Medicaid/sponsorship through [facility] and Families [address]                                    [place], [place] [phone] Therapy/tele-psych/case  [facility] [address] [place], [place] [phone]    Dr. Lolly Mustache  [phone]   [facility]  United Way [facility] Dept. 1) [address] [place] 2) [address] [place] 3)  [address], [place] [phone]  [phone]   [facility] Child Abuse Hotline 215 355 4235 or [phone] (After Hours)      Behavioral Health Resources in the [facility]  Intensive Outpatient Programs: [facility]      [address] [place], [place] [phone] Both a day and evening program       [facility]     [address]        [place], [place] [address] [phone]         ADS: Alcohol & Drug Svcs [address] [place] [phone]  [facility] Mental Health ACCESS LINE: [phone] or [phone] [place], [place] [address] [email or web address]  Mobile Crisis Teams:                                        [facility]  Mobile Crisis Care Unit [phone]             [facility] [address] Ginette Otto [phone]                                         Interventionist [address] [place] [phone]  Self-Help/Support Groups: Mental Health Assoc. of [employer] of support groups 949-763-2386 (call for more info)  Narcotics Anonymous (NA) [facility] [address] [place] - 2 meetings at this location  Residential Treatment Programs:  [facility]      [address]        [place]       [phone]         [facility] [address] [place] 010272 [place], [place]  [address] [phone]  [facility]  [address] [place], [place] [address] [phone] Admissions: 8am-3pm M-F  [facility]     [address]        [place], [place]  [address]       [phone]         [facility] [address] Starling Manns [place], [place] [phone]  [facility] [address] [place], [place] [phone]  [facility] - Intensive Outpatient      [address]     [place], [place]       [phone]         [facility] (Addiction Recovery Care [facility]oc[facility]     [address] [place], [place] [phone] or [phone]  [facility]idential Treatment Services (RTS)  [address] [place], [place] [phone]  [facility] [address]                                               [address] [place] [phone]  [facility] Resources: [place] [facility] [phone]               General Therapy                                                Angie Fava, PhD        [address] [place], [place] [address]         [phone]   Insurance  [facility] Behavioral   [address] [place], [place] [address] [phone]  [facility] [address] [place], [place] [address] [phone] Insurance/Medicaid/sponsorship through [facility]  Faith and Families                                              [address] [phone]  [place], [place] [address]    Therapy/tele-psych/case         [phone]          [facility] [address], [place]  [address]  Adolescent/group home/case management [phone]                                           Creola Corn PhD       General therapy       Insurance   [phone]         Dr. Lolly Mustache Insurance [phone] M-F  [place] Detox/Residential Medicaid, sponsorship [phone]

## 2015-09-07 NOTE — ED Notes (Signed)
c-collar applied at triage 
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
## 2015-09-07 NOTE — ED Notes (Signed)
Pt states he did not stay to be treated yesterday when he came to ED due to wait. States he hit his chest yesterday while helping sister move. Pt states he has "anxiety" and has frequent panic attacks.

## 2015-09-07 NOTE — ED Provider Notes (Signed)
[facility] Emergency Department Provider Note  ____________________________________________  Time seen: Approximately 3:42 AM  I have reviewed the triage vital signs and the nursing notes.   HISTORY  Chief Complaint Shoulder Injury    HPI Eugene Zimmerman is a 35 y.o. male with a history of multiple emergency department visits throughout the area over the last 6+ months and a report of multiple orthopedic injuries and narcotic seeking behavior.  He presents tonight complaining of acute onset severe left shoulder pain while helping his sister move an appliance down some stairs.  Of note, he came to the [facility] emergency department yesterday complaining of severe right shoulder pain after falling off of a ladder.  He was not found to have any acute injuries and was discharged.  He has had other recent visits for variety of orthopedic or musculoskeletal pain complaints.  He reports that he has no numbness or tingling in his left arm but that he has severe pain with any range of motion of the arm and shoulder.  He states he feels like he cannot lift it up into the air.  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 Roxine Caddy in [place].  He also has chronic issues with his right shoulder.He denies any injury to his head, neck, chest, or abdomen and denies pain in all those regions as well.   Past Medical History  Diagnosis Date  . Anxiety   . Anxiety     There are no active problems to display for this patient.   Past Surgical History  Procedure Laterality Date  . Other surgical history      Compartment syndrome release - right hand  . Foot surgery Left     Current Outpatient Rx  Name  Route  Sig  Dispense  Refill  . citalopram (CELEXA) 20 MG tablet   Oral   Take 20 mg by mouth daily.         . clonazePAM (KLONOPIN) 0.5 MG tablet   Oral   Take 0.5 mg by mouth as  needed for anxiety.         . cyclobenzaprine (FLEXERIL) 5 MG tablet   Oral   Take 1 tablet (5 mg total) by mouth 3 (three) times daily as needed for muscle spasms.   15 tablet   0   . cyclobenzaprine (FLEXERIL) 5 MG tablet   Oral   Take 1 tablet (5 mg total) by mouth every 8 (eight) hours as needed for muscle spasms.   20 tablet   0   . nabumetone (RELAFEN) 750 MG tablet   Oral   Take 1 tablet (750 mg total) by mouth 2 (two) times daily.   30 tablet   0     Allergies Norco and Tramadol  No family history on file.  Social History Social History  Substance Use Topics  . Smoking status: Current Every Day Smoker -- 0.50 packs/day  . Smokeless tobacco: Never Used  . Alcohol Use: No    Review of Systems Constitutional: No fever/chills Cardiovascular: Denies chest pain. Respiratory: Denies shortness of breath. Gastrointestinal: No abdominal pain.  No nausea, no vomiting.  No diarrhea.  No constipation. Musculoskeletal: Severe pain in left shoulder.  Chronic pain in right shoulder. Neurological: Negative for headaches, focal weakness or numbness including affected extremity   ____________________________________________   PHYSICAL EXAM:  VITAL SIGNS: ED Triage  Vitals  Enc Vitals Group     BP 09/06/15 2334 126/82 mmHg     Pulse Rate 09/06/15 2334 93     Resp 09/06/15 2334 18     Temp 09/06/15 2334 97.6 F (36.4 C)     Temp Source 09/06/15 2334 Oral     SpO2 09/06/15 2334 97 %     Weight 09/06/15 2334 190 lb (86.183 kg)     Height 09/06/15 2334 6\' 3"  (1.905 m)     Head Cir --      Peak Flow --      Pain Score 09/06/15 2339 10     Pain Loc --      Pain Edu? --      Excl. in GC? --     Constitutional: Alert and oriented. Well appearing and in no acute distress. Eyes: Conjunctivae are normal. PERRL. EOMI. Head: Atraumatic. Mouth/Throat: Mucous membranes are moist.  Oropharynx non-erythematous. Cardiovascular: Normal rate, regular rhythm. Good peripheral  circulation. Respiratory: Normal respiratory effort.  No retractions. Lungs CTAB. Gastrointestinal: Soft and nontender.  Musculoskeletal: Pain with active and passive ROM of L shoulder, but ROM is not mechanically limited.  No gross deformity nor contusion/ecchymosis. Neurologic:  Normal speech and language. No gross focal neurologic deficits are appreciated.  Skin:  Skin is warm, dry and intact. No rash noted. Psychiatric: Mood and affect are normal. Speech and behavior are normal.  ____________________________________________   LABS (all labs ordered are listed, but only abnormal results are displayed)  Labs Reviewed - No data to display ____________________________________________  EKG  None ____________________________________________  RADIOLOGY   Dg Shoulder Left  09/07/2015  CLINICAL DATA:  Felt a pop in left shoulder, question left shoulder dislocation. EXAM: LEFT SHOULDER - 2+ VIEW COMPARISON:  Radiographs 08/23/2007 FINDINGS: No fracture or dislocation. The alignment and joint spaces are maintained. Humeral head normally located. Ossified intra-articular body is unchanged from prior exam. IMPRESSION: No fracture or dislocation of the left shoulder. Unchanged intra-articular body, chronic. Electronically Signed   By: Rubye Oaks M.D.   On: 09/07/2015 00:31    ____________________________________________   PROCEDURES  Procedure(s) performed: None  Critical Care performed: No ____________________________________________   INITIAL IMPRESSION / ASSESSMENT AND PLAN / ED COURSE  Pertinent labs & imaging results that were available during my care of the patient were reviewed by me and considered in my medical decision making (see chart for details).  There is no evidence of traumatic injury and he is neurovascularly intact.  His x-rays were reassuring for no bony injury or dislocation.  I am concerned about the strong possibility of the patient seeking secondary gain  (narcotics) given his multiple visits with a variety of complaints and inconsistent stories, well-documented on his other emergency department visits as well.  I printed out his report for the last 6 months from the [place] controlled substances database and it shows he has had 12 prescriptions for prescription pain medicine and numerous prescriptions for benzodiazepines all from a variety of providers.  I strongly encourage future providers to be cautious and the administration of these substances to this patient.  Regardless, he states that he has a follow-up appointment with orthopedic surgery in 4 days and I encouraged him to follow up as scheduled.  ____________________________________________  FINAL CLINICAL IMPRESSION(S) / ED DIAGNOSES  Final diagnoses:  Left shoulder pain      NEW MEDICATIONS STARTED DURING THIS VISIT:  New Prescriptions   No medications on file  Loleta Rose, MD 09/07/15 [phone]

## 2015-09-07 NOTE — ED Notes (Signed)
Pt observed sleeping in waiting room, able to grasp arms of chair w/ both hands and pull self into an upright position before standing. Pt c/o pain secondary to catching a stove that was being moved down a flight of stairs. Pt states he felt a pop in L shoulder. Pt states he has prior hx of shoulder pain and injury which he is to have surgery to repair his L rotator cuff.
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
